# Patient Record
Sex: Male | Born: 2000 | Race: Black or African American | Hispanic: No | Marital: Single | Smoking: Never smoker
Health system: Southern US, Community
[De-identification: ages and names within clinical notes are randomized; demographics above are authoritative.]

## PROBLEM LIST (undated history)

## (undated) DIAGNOSIS — F1990 Other psychoactive substance use, unspecified, uncomplicated: Secondary | ICD-10-CM

## (undated) DIAGNOSIS — F319 Bipolar disorder, unspecified: Secondary | ICD-10-CM

## (undated) DIAGNOSIS — F411 Generalized anxiety disorder: Secondary | ICD-10-CM

## (undated) DIAGNOSIS — J45909 Unspecified asthma, uncomplicated: Secondary | ICD-10-CM

## (undated) HISTORY — DX: Unspecified asthma, uncomplicated: J45.909

## (undated) HISTORY — DX: Generalized anxiety disorder: F41.1

## (undated) HISTORY — DX: Bipolar disorder, unspecified: F31.9

## (undated) HISTORY — DX: Other psychoactive substance use, unspecified, uncomplicated: F19.90

---
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
## 2023-03-16 ENCOUNTER — Emergency Department
Admission: EM | Admit: 2023-03-16 | Discharge: 2023-03-16 | Disposition: A | Discharge status: Home / Self Care | Payer: No Typology Code available for payment source | Attending: Emergency Medicine | Admitting: Emergency Medicine

## 2023-03-16 DIAGNOSIS — F1510 Other stimulant abuse, uncomplicated: Secondary | ICD-10-CM | POA: Insufficient documentation

## 2023-03-16 NOTE — ED Provider Notes (Signed)
[facility] EMERGENCY DEPARTMENT H&P      Visit date: 03/16/2023      CLINICAL SUMMARY          Diagnosis:    .     Final diagnoses:   Methamphetamine abuse         MDM Notes:      I am the first provider for this patient.  I reviewed the vital signs, nursing notes, past medical history, past surgical history, family history and social history.  I have reviewed the patient's previous charts.    Medical Decision Making    PMP and available external data reviewed.     Pt is medically cleared for further out patient substance abuse care . there are no toxidromes.  After psych evaluation Patient no longer appeared to be an imminent danger to themself or others and appeared to achieve maximal benefit of ED evaluation.  They verbalized understanding of the plan of care and felt comfortable with the plan.          Disposition:      Discharge         Discharge Prescriptions    None                     CLINICAL INFORMATION        HPI:      Chief Complaint: Inpatient Detox  .    Terry Wiggins is a 22 y.o. male with PMHx of asthma, GAD, Manic depression, and substance use disorder  who presents with substance use relapse.    Pt that he was in recovery from methamphetamine abuse. He reports that yday morning at ~9-10 am he relapsed. Pt reports that he did have inpatient appointment earlier yday morning. Was brought to ED here by family, reports that visit was involuntary. Pt reports eating and drinking has been minimal recently.    No recent fever, cough, SOB, chest or abd pain, no n/v/d, urinary or neurologic c/o's. No sick contacts.      History obtained from: Patient          ROS:      Positive and Negative ROS elements as per HPI  All Other Systems Reviewed and Negative: Yes      Physical Exam:      Pulse (!) 110  BP (!) 148/115  Resp 16  SpO2 98 %  Temp 98.6 F (37 C)    Physical Exam   Nursing note and vitals reviewed.  Constitutional:Pt is oriented to person, place, and time. Pt appears well-developed and  well-nourished. No distress.   HEENT:   Head: Normocephalic and atraumatic.   Right Ear: External ear normal.   Left Ear: External ear normal.   Nose: Nose normal.   Mouth/Throat: Oropharynx is clear and moist. No oropharyngeal exudate.   Eyes: Conjunctivae and EOM are normal. Pupils are equal, round, and reactive to light. Right eye exhibits no discharge. Left eye exhibits no discharge. No scleral icterus.   Neck: Normal range of motion. Neck supple. No JVD present. No tracheal deviation present. No thyromegaly present.   Musculoskeletal:  No calf tenderness or edema. Distal pulses intact  Neurological:  No cranial nerve deficit. Pt exhibits normal muscle tone. Coordination normal. MAE.  Skin: Skin is warm and dry. Pt is not diaphoretic.   Psychiatric: Pt has a normal mood and affect. Behavior is normal. Judgment and thought content normal  PAST HISTORY        Primary Care Provider: No primary care provider on file.        PMH/PSH:    .     Past Medical History:   Diagnosis Date    Asthma     GAD (generalized anxiety disorder)     Manic depression     Substance use disorder     meth       He has no past surgical history on file.      Social/Family History:      He reports that he has never smoked. He has never used smokeless tobacco. He reports that he does not currently use alcohol. He reports current drug use.    History reviewed. No pertinent family history.      Listed Medications on Arrival:    .     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            VISIT INFORMATION        Clinical Course in the ED:       Spoke with the [facility] intake midlevel and they do not detox methamphetamine.      Medications  Given in the ED:    .     ED Medication Orders (From admission, onward)      None              Procedures:      Procedures      Interpretations:      Pulse ox reviewed by me. All ordered labs and images reviewed by me.     O2 sat-           saturation: 98 %; Oxygen use: room air; Interpretation: Normal    Monitor -         interpreted by me: sinus tachycardia at 110.                 RESULTS        Lab Results:      Results       ** No results found for the last 24 hours. **                Radiology Results:      No orders to display               Scribe Attestation:      I was acting as a Neurosurgeon for Terry Lindau, MD on [name]  Treatment Team: Scribe: Terry Wiggins   I am the first provider for this patient and I personally performed the services documented. Treatment Team: Scribe: Terry Wiggins is scribing for me on [name]. This note accurately reflects work and decisions made by me.  Terry Lindau, MD            Terry Pickles, MD  03/16/23 [phone]

## 2023-03-16 NOTE — ED Triage Notes (Signed)
Pt presents to ED requesting detox from meth. Pt states he had a relapse starting Thursday or Friday of last week, last use was Monday afternoon. Pt unsure if family called [facility] or not. PMH anxiety, depression, bipolar, asthma. Pt states he has been mostly compliant with home medications, may have missed some doses last week. Denies SI/HI/AVH. AAOx4, respirations even and unlabored at this time.

## 2023-03-16 NOTE — Discharge Instructions (Signed)
Follow-up in morning to your scheduled detox.
